# Patient Record
Sex: Male | Born: 1985 | Race: Black or African American | Hispanic: No | Marital: Single | Smoking: Current every day smoker
Health system: Southern US, Community
[De-identification: ages and names within clinical notes are randomized; demographics above are authoritative.]

---

## 2006-06-24 ENCOUNTER — Emergency Department (HOSPITAL_COMMUNITY): Admission: EM | Admit: 2006-06-24 | Discharge: 2006-06-24 | Payer: Self-pay | Admitting: Emergency Medicine

## 2006-07-02 ENCOUNTER — Emergency Department (HOSPITAL_COMMUNITY): Admission: EM | Admit: 2006-07-02 | Discharge: 2006-07-02 | Payer: Self-pay | Admitting: Emergency Medicine

## 2013-11-01 ENCOUNTER — Encounter (HOSPITAL_COMMUNITY): Payer: Self-pay | Admitting: Emergency Medicine

## 2013-11-01 ENCOUNTER — Emergency Department (HOSPITAL_COMMUNITY)
Admission: EM | Admit: 2013-11-01 | Discharge: 2013-11-01 | Disposition: A | Discharge status: Home / Self Care | Payer: Self-pay | Attending: Emergency Medicine | Admitting: Emergency Medicine

## 2013-11-01 DIAGNOSIS — R51 Headache: Secondary | ICD-10-CM | POA: Insufficient documentation

## 2013-11-01 DIAGNOSIS — F17200 Nicotine dependence, unspecified, uncomplicated: Secondary | ICD-10-CM | POA: Insufficient documentation

## 2013-11-01 DIAGNOSIS — R519 Headache, unspecified: Secondary | ICD-10-CM

## 2013-11-01 DIAGNOSIS — H53149 Visual discomfort, unspecified: Secondary | ICD-10-CM | POA: Insufficient documentation

## 2013-11-01 NOTE — ED Notes (Signed)
Pt to department c/o of headache. Reports it has been present for a couple of weeks. Reports light sensitivity, blurry vision. Reports more pain on the left side.

## 2013-11-01 NOTE — ED Notes (Signed)
Pt states hes been having in and out headaches for the last couple years. Pt denies taking anything for it. Seasonal headaches. Comes and goes same time every year. Pt thinks he has cluster headaches. Pt currently denies any pain at this time. Pt states he last had a headache at 4pm, its gone now.

## 2013-11-01 NOTE — ED Provider Notes (Signed)
CSN: [id number]     Arrival date & time 11/01/13  1758 History   First MD Initiated Contact with Patient 11/01/13 1843     Chief Complaint  Patient presents with  . Headache   (Consider location/radiation/quality/duration/timing/severity/associated sxs/prior Treatment) HPI Comments: Patient presents to the emergency department with chief complaint of headaches. He states that he's been having intermittent headaches for the past 4-5 years. He states that the headaches are worsened in the winter. He states that he has never been worked up for headaches before. He states that they come and go, and that he is concerned about cluster headaches. He denies headache at this time, but wishes to be evaluated. He denies any fevers or chills. States the leg does have headaches he has photophobia and phonophobia, and states the headaches are more located on the left side. Denies any neurologic deficits. Denies any ataxia. He states that he normally treats the headache with BC powder, which helps significantly.  The history is provided by the patient. No language interpreter was used.    History reviewed. No pertinent past medical history. History reviewed. No pertinent past surgical history. History reviewed. No pertinent family history. History  Substance Use Topics  . Smoking status: Current Every Day Smoker    Types: Cigarettes  . Smokeless tobacco: Not on file  . Alcohol Use: Yes    Review of Systems  All other systems reviewed and are negative.    Allergies  Review of patient's allergies indicates no known allergies.  Home Medications   Current Outpatient Rx  Name  Route  Sig  Dispense  Refill  . Aspirin-Salicylamide-Caffeine (BC HEADACHE POWDER PO)   Oral   Take 1 packet by mouth 2 (two) times daily as needed (pain).         Marland Kitchen diphenhydramine-acetaminophen (TYLENOL PM) 25-500 MG TABS   Oral   Take 1 tablet by mouth at bedtime as needed.          BP 140/76  Pulse 73   Temp(Src) 98.2 F (36.8 C) (Oral)  Resp 18  Ht 5\' 10"  (1.778 m)  Wt 190 lb 2 oz (86.24 kg)  BMI 27.28 kg/m2  SpO2 97% Physical Exam  Nursing note and vitals reviewed. Constitutional: He is oriented to person, place, and time. He appears well-developed and well-nourished.  HENT:  Head: Normocephalic and atraumatic.  Right Ear: External ear normal.  Left Ear: External ear normal.  Eyes: Conjunctivae and EOM are normal. Pupils are equal, round, and reactive to light.  Neck: Normal range of motion. Neck supple.  No pain with neck flexion, no meningismus  Cardiovascular: Normal rate, regular rhythm and normal heart sounds.  Exam reveals no gallop and no friction rub.   No murmur heard. Pulmonary/Chest: Effort normal and breath sounds normal. No respiratory distress. He has no wheezes. He has no rales. He exhibits no tenderness.  Abdominal: Soft. He exhibits no distension. There is no tenderness.  Musculoskeletal: Normal range of motion. He exhibits no edema and no tenderness.  Normal gait.  Neurological: He is alert and oriented to person, place, and time.  CN 3-12 intact, no pronator drift, normal shin to heel, normal RAM, sensation and strength intact bilaterally.  Skin: Skin is warm and dry.  Psychiatric: He has a normal mood and affect. His behavior is normal. Judgment and thought content normal.    ED Course  Procedures (including critical care time) Labs Review Labs Reviewed - No data to display Imaging Review No results  found.  EKG Interpretation   None       MDM   1. Headache    No headache in the ED.  Presentation is concerning for cluster HA and non concerning for [facility], ICH, Meningitis, or temporal arteritis. Pt is afebrile with no focal neuro deficits, nuchal rigidity, or change in vision. Pt is to follow up with neurology to discuss prophylactic medication. Pt verbalizes understanding and is agreeable with plan to dc.        Roxy Horseman, PA-C 11/01/13  1900

## 2013-11-02 NOTE — ED Provider Notes (Signed)
  Medical screening examination/treatment/procedure(s) were performed by non-physician practitioner and as supervising physician I was immediately available for consultation/collaboration.  EKG Interpretation   None          Eriyana Sweeten, MD 11/02/13 0036 

## 2016-03-30 ENCOUNTER — Emergency Department (HOSPITAL_COMMUNITY): Payer: 59

## 2016-03-30 ENCOUNTER — Encounter (HOSPITAL_COMMUNITY): Payer: Self-pay | Admitting: Emergency Medicine

## 2016-03-30 ENCOUNTER — Emergency Department (HOSPITAL_COMMUNITY)
Admission: EM | Admit: 2016-03-30 | Discharge: 2016-03-30 | Disposition: A | Discharge status: Home / Self Care | Payer: 59 | Attending: Emergency Medicine | Admitting: Emergency Medicine

## 2016-03-30 DIAGNOSIS — M26602 Left temporomandibular joint disorder, unspecified: Secondary | ICD-10-CM | POA: Insufficient documentation

## 2016-03-30 DIAGNOSIS — F17210 Nicotine dependence, cigarettes, uncomplicated: Secondary | ICD-10-CM | POA: Diagnosis not present

## 2016-03-30 DIAGNOSIS — M26609 Unspecified temporomandibular joint disorder, unspecified side: Secondary | ICD-10-CM

## 2016-03-30 DIAGNOSIS — R6884 Jaw pain: Secondary | ICD-10-CM | POA: Insufficient documentation

## 2016-03-30 MED ORDER — CYCLOBENZAPRINE HCL 10 MG PO TABS: 10.0000 mg | ORAL_TABLET | Freq: Every day | ORAL | Status: DC

## 2016-03-30 MED ORDER — NAPROXEN 500 MG PO TABS
500.0000 mg | ORAL_TABLET | Freq: Two times a day (BID) | ORAL | Status: DC
Start: 2016-03-30 — End: 2021-03-22

## 2016-03-30 MED ORDER — HYDROCODONE-ACETAMINOPHEN 5-325 MG PO TABS
1.0000 | ORAL_TABLET | Freq: Once | ORAL | Status: AC
  Administered 2016-03-30: 1 via ORAL
  Filled 2016-03-30: qty 1

## 2016-03-30 NOTE — ED Notes (Signed)
Started yesterday with left jaw pain. No known injury. States is difficult to open his mouth.

## 2016-03-30 NOTE — Discharge Instructions (Signed)
Please read and follow all provided instructions.  Your child's diagnoses today include:  1. Temporomandibular disorder   2. Jaw pain    Tests performed today include:  Vital signs. See below for results today.   Medications prescribed:   Naproxen - anti-inflammatory pain medication  Do not exceed 500mg  naproxen every 12 hours, take with food  You have been prescribed an anti-inflammatory medication or NSAID. Take with food. Take smallest effective dose for the shortest duration needed for your pain. Stop taking if you experience stomach pain or vomiting.    Flexeril (cyclobenzaprine) - muscle relaxer medication  DO NOT drive or perform any activities that require you to be awake and alert because this medicine can make you drowsy.   Take any prescribed medications only as directed.  Home care instructions:  Follow any educational materials contained in this packet.  Eat soft foods for the next 48-72 hours to let your jaw rest.   Follow-up instructions: Please follow-up with the dentist referral if you are not better in 72 hours.    Return instructions:   Please return to the Emergency Department if your child experiences worsening symptoms.   Please return if you have any other emergent concerns.  Additional Information:  Your child's vital signs today were: BP 126/57 mmHg   Pulse 76   Temp(Src) 98.5 F (36.9 C)   Resp 18   Ht 5\' 10"  (1.778 m)   Wt 95.255 kg   BMI 30.13 kg/m2   SpO2 98% If blood pressure (BP) was elevated above 135/85 this visit, please have this repeated by your pediatrician within one month. --------------

## 2016-03-30 NOTE — ED Provider Notes (Signed)
CSN: [id number]     Arrival date & time 03/30/16  4098 History  By signing my name below, I, Austin Rivers, attest that this documentation has been prepared under the direction and in the presence of Austin Crigler, PA-C. Electronically Signed: Evon Rivers, ED Scribe. 03/30/2016. 9:35 AM.  Chief Complaint  Patient presents with  . Jaw Pain    The history is provided by the patient. No language interpreter was used.   HPI Comments: Austin Rivers is a 30 y.o. male who presents to the Emergency Department complaining of gradually worsening left sided jaw/TMJ pain onset 1 day prior. Pt doesn't report any associated symptoms. Pt states that the pain is worse when he tried to open his mouth. Pt denies injury or trauma to the jaw. Denies dental pain, neck pain or trouble swallowing. No fever, neck swelling or trouble breathing.    History reviewed. No pertinent past medical history. History reviewed. No pertinent past surgical history. No family history on file. Social History  Substance Use Topics  . Smoking status: Current Every Day Smoker    Types: Cigarettes  . Smokeless tobacco: None  . Alcohol Use: No    Review of Systems  Constitutional: Negative for fever.  HENT: Negative for dental problem, ear pain, facial swelling, sore throat and trouble swallowing.   Respiratory: Negative for shortness of breath and stridor.   Musculoskeletal: Positive for arthralgias (left sided jaw pain). Negative for neck pain.  Skin: Negative for color change.  Neurological: Negative for headaches.     Allergies  Review of patient's allergies indicates no known allergies.  Home Medications   Prior to Admission medications   Medication Sig Start Date End Date Taking? Authorizing Provider  Aspirin-Salicylamide-Caffeine (BC HEADACHE POWDER PO) Take 1 packet by mouth 2 (two) times daily as needed (pain).    Historical Provider, MD  diphenhydramine-acetaminophen (TYLENOL PM) 25-500 MG TABS Take 1  tablet by mouth at bedtime as needed.    Historical Provider, MD   BP 126/57 mmHg  Pulse 76  Temp(Src) 98.5 F (36.9 C)  Resp 18  Ht  (1.778 m)  Wt 210 lb (95.255 kg)  BMI 30.13 kg/m2  SpO2 98%   Physical Exam  Constitutional: He appears well-developed and well-nourished. No distress.  HENT:  Head: Normocephalic and atraumatic.  Right Ear: Tympanic membrane, external ear and ear canal normal.  Left Ear: Tympanic membrane, external ear and ear canal normal.  Nose: Nose normal.  Mouth/Throat: Uvula is midline, oropharynx is clear and moist and mucous membranes are normal. No trismus in the jaw. Normal dentition. No dental abscesses or uvula swelling. No tonsillar abscesses.  Tenderness to palpation over the left TMJ. No swelling. Intraoral evaluation appears normal without gross or palpable abscess. Patient has some difficulty opening his mouth but this seems to be due more to pain than a dislocation.   Eyes: Conjunctivae and EOM are normal. Pupils are equal, round, and reactive to light.  Neck: Normal range of motion. Neck supple. No tracheal deviation present.  No neck swelling or Lugwig's angina  Cardiovascular: Normal rate.   Pulmonary/Chest: Effort normal. No respiratory distress.  Musculoskeletal: Normal range of motion.  Neurological: He is alert.  Skin: Skin is warm and dry.  Psychiatric: He has a normal mood and affect. His behavior is normal.  Nursing note and vitals reviewed.   ED Course  Procedures (including critical care time) DIAGNOSTIC STUDIES: Oxygen Saturation is 99% on RA, normal by my interpretation.  COORDINATION OF CARE: 9:33 AM-Will order panorex. Will give Vicodin for pain. Discussed treatment plan with pt at bedside and pt agreed to plan.   10:35 AM- Informed of negative x-Austin Rivers results. Joint appears located. Discussed with plan for dental follow up in 2-3 days if not improved. Encouraged soft foods for next 48-72 hrs. Will discharge with NSAIDs  and muscle relaxer's.  Patient counseled on proper use of muscle relaxant medication.  They were told not to drink alcohol, drive any vehicle, or do any dangerous activities while taking this medication.  Patient verbalized understanding.   Imaging Review Dg Orthopantogram  03/30/2016  CLINICAL DATA:  Left-sided jaw pain no known injury, initial encounter EXAM: ORTHOPANTOGRAM/PANORAMIC COMPARISON:  None. FINDINGS: The mandible is intact. No acute bony abnormality is noted. No focal dental abnormality is seen. IMPRESSION: No acute abnormality noted. Electronically Signed   By: Alcide CleverMark  Rivers M.D.   On: 03/30/2016 10:10     Vital signs reviewed and are as follows: Filed Vitals:   03/30/16 0905  BP: 126/57  Pulse: 76  Temp: 98.5 F (36.9 C)  Resp: 18     MDM   Final diagnoses:  Jaw pain  Temporomandibular disorder   Patient with discreet L TMJ pain. No neck swelling To suggest deep space infection and trismus. No dental infections. Unclear etiology of the patient's pain. Jaw appears located. He can open jaw somewhat but limited due to pain. No systemic symptoms of illness suggestive of joint infection. Dental follow-up given as there is no oral surgery referral on today.  I personally performed the services described in this documentation, which was scribed in my presence. The recorded information has been reviewed and is accurate.       Austin CriglerJoshua Pricella Gaugh, PA-C 03/30/16 1056  Linwood DibblesJon Knapp, MD 03/31/16 1010

## 2020-01-14 ENCOUNTER — Other Ambulatory Visit: Payer: Self-pay

## 2020-01-14 ENCOUNTER — Encounter (HOSPITAL_COMMUNITY): Payer: Self-pay | Admitting: Emergency Medicine

## 2020-01-14 ENCOUNTER — Ambulatory Visit (HOSPITAL_COMMUNITY)
Admission: EM | Admit: 2020-01-14 | Discharge: 2020-01-14 | Disposition: A | Discharge status: Home / Self Care | Payer: Self-pay | Attending: Urgent Care | Admitting: Urgent Care

## 2020-01-14 DIAGNOSIS — F17200 Nicotine dependence, unspecified, uncomplicated: Secondary | ICD-10-CM

## 2020-01-14 DIAGNOSIS — R0982 Postnasal drip: Secondary | ICD-10-CM

## 2020-01-14 DIAGNOSIS — R519 Headache, unspecified: Secondary | ICD-10-CM

## 2020-01-14 DIAGNOSIS — G43809 Other migraine, not intractable, without status migrainosus: Secondary | ICD-10-CM

## 2020-01-14 MED ORDER — SUMATRIPTAN SUCCINATE 6 MG/0.5ML ~~LOC~~ SOLN
SUBCUTANEOUS | Status: AC
  Filled 2020-01-14: qty 0.5

## 2020-01-14 MED ORDER — SUMATRIPTAN SUCCINATE 50 MG PO TABS: ORAL_TABLET | ORAL | 0 refills | Status: DC

## 2020-01-14 MED ORDER — SUMATRIPTAN SUCCINATE 6 MG/0.5ML ~~LOC~~ SOLN
6.0000 mg | Freq: Once | SUBCUTANEOUS | Status: AC
  Administered 2020-01-14: 10:00:00 6 mg via SUBCUTANEOUS

## 2020-01-14 NOTE — ED Provider Notes (Signed)
[name]   MRN: [id number] DOB: 12/02/1985  Subjective:   Austin Rivers is a 34 y.o. male presenting for 12-day history of persistent intermittent left-sided temporal headaches worse at night.  States that he also has some sinus pressure on that side.  Gets blurred vision with his headaches but not loss of vision.  Has a history of migraines.  Patient is also an active smoker.  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  No current facility-administered medications for this encounter.  Current Outpatient Medications:  .  Aspirin-Salicylamide-Caffeine (BC HEADACHE POWDER PO), Take 1 packet by mouth 2 (two) times daily as needed (pain)., Disp: , Rfl:  .  cyclobenzaprine (FLEXERIL) 10 MG tablet, Take 1 tablet (10 mg total) by mouth at bedtime. (Patient not taking: Reported on 01/14/2020), Disp: 10 tablet, Rfl: 0 .  diphenhydramine-acetaminophen (TYLENOL PM) 25-500 MG TABS, Take 1 tablet by mouth at bedtime as needed., Disp: , Rfl:  .  naproxen (NAPROSYN) 500 MG tablet, Take 1 tablet (500 mg total) by mouth 2 (two) times daily., Disp: 20 tablet, Rfl: 0   No Known Allergies  History reviewed. No pertinent past medical history.   History reviewed. No pertinent surgical history.  History reviewed. No pertinent family history.  Social History   Tobacco Use  . Smoking status: Current Every Day Smoker    Types: Cigarettes  Substance Use Topics  . Alcohol use: No  . Drug use: No    Review of Systems  Constitutional: Negative for fever and malaise/fatigue.  HENT: Negative for congestion, ear pain, sinus pain and sore throat.   Eyes: Positive for blurred vision and photophobia. Negative for double vision, pain, discharge and redness.  Respiratory: Negative for cough, hemoptysis, shortness of breath and wheezing.   Cardiovascular: Negative for chest pain.  Gastrointestinal: Negative for abdominal pain, diarrhea, nausea and vomiting.  Genitourinary:  Negative for dysuria, flank pain and hematuria.  Musculoskeletal: Negative for myalgias.  Skin: Negative for rash.  Neurological: Positive for headaches. Negative for dizziness, tingling, tremors, focal weakness and weakness.  Psychiatric/Behavioral: Negative for depression and substance abuse.     Objective:   Vitals: BP 129/84 (BP Location: Right Arm)   Pulse 85   Temp 97.9 F (36.6 C) (Oral)   Resp 18   SpO2 98%   Physical Exam Constitutional:      General: He is not in acute distress.    Appearance: Normal appearance. He is well-developed and normal weight. He is not ill-appearing, toxic-appearing or diaphoretic.  HENT:     Head: Normocephalic and atraumatic.     Right Ear: Tympanic membrane, ear canal and external ear normal. There is no impacted cerumen.     Left Ear: Tympanic membrane, ear canal and external ear normal. There is no impacted cerumen.     Nose: Congestion present. No rhinorrhea.     Mouth/Throat:     Mouth: Mucous membranes are moist.     Pharynx: No oropharyngeal exudate or posterior oropharyngeal erythema.     Comments: Significant postnasal drainage overlying pharynx. Eyes:     General: No scleral icterus.       Right eye: No discharge.        Left eye: No discharge.     Extraocular Movements: Extraocular movements intact.     Conjunctiva/sclera: Conjunctivae normal.     Pupils: Pupils are equal, round, and reactive to light.  Cardiovascular:     Rate and Rhythm: Normal rate.  Pulmonary:     Effort: Pulmonary effort is normal.  Musculoskeletal:     Cervical back: Normal range of motion and neck supple. No rigidity. No muscular tenderness.  Neurological:     General: No focal deficit present.     Mental Status: He is alert and oriented to person, place, and time.     Cranial Nerves: No cranial nerve deficit or facial asymmetry.     Motor: No weakness.     Coordination: Romberg sign negative. Coordination normal.     Gait: Gait normal.    Psychiatric:        Mood and Affect: Mood normal.        Behavior: Behavior normal.        Thought Content: Thought content normal.        Judgment: Judgment normal.     Assessment and Plan :   1. Other migraine without status migrainosus, not intractable   2. Temporal headache   3. Post-nasal drainage   4. Smoker    Patient given subcutaneous sumatriptan to address his concerns for cluster headache.  Counseled patient that I suspect he is having migraines triggered by sinusitis given his physical exam findings.  Patient is neurologically intact, counseled against imaging or ER visit at this time.  He denied concerns for sinusitis and prefers management with sumatriptan.  Provided him with information for [facility] internal medicine to establish with a new PCP.  Encourage patient to consider further work-up and outpatient imaging if he continues to have symptoms. Counseled patient on potential for adverse effects with medications prescribed/recommended today, ER and return-to-clinic precautions discussed, patient verbalized understanding.    Wallis Bamberg, PA-C 01/14/20 1046

## 2020-01-14 NOTE — ED Triage Notes (Signed)
Pt sts HA to left side of head x 2 weeks

## 2021-03-21 ENCOUNTER — Other Ambulatory Visit: Payer: Self-pay

## 2021-03-21 ENCOUNTER — Ambulatory Visit (INDEPENDENT_AMBULATORY_CARE_PROVIDER_SITE_OTHER): Payer: 59 | Admitting: Family Medicine

## 2021-03-21 VITALS — BP 126/70 | HR 67 | Ht 70.0 in | Wt 175.6 lb

## 2021-03-21 DIAGNOSIS — F17200 Nicotine dependence, unspecified, uncomplicated: Secondary | ICD-10-CM | POA: Diagnosis not present

## 2021-03-21 DIAGNOSIS — R42 Dizziness and giddiness: Secondary | ICD-10-CM

## 2021-03-21 MED ORDER — NICOTINE 14 MG/24HR TD PT24: 14.0000 mg | MEDICATED_PATCH | Freq: Every day | TRANSDERMAL | 0 refills | Status: AC

## 2021-03-21 NOTE — Patient Instructions (Addendum)
It was great seeing you today! Today you came to establish care. We addressed your dizzy spells which I believe are due to what we talked about called orthostatic hypotension (more information below). Please increase your water intake, aiming to drink half your body weight in ounces.   I also prescribed a nicotine patch to help quit smoking.    For your allergies I recommend getting a nasal glucocorticoid spray which you can get over the counter.   Please check-out at the front desk before leaving the clinic. I'd like to see you back in 4-6 weeks to check on how the nicotine patch has been working, as well as follow up on your dizzy spells, but if you need to be seen earlier than that for any new issues we're happy to fit you in, just give [place] a call!  Visit Reminders: - Stop by the pharmacy to pick up your prescriptions - Continue to work on your healthy eating habits, drinking more water, and incorporating exercise into your daily life.   If you haven't already, sign up for My Chart to have easy access to your labs results, and communication with your primary care physician.  Feel free to call with any questions or concerns at any time, at [phone].   Take care,  Dr. Cora Collum [facility]   Nicotine skin patches What is this medicine? NICOTINE (NIK [place] teen) helps people stop smoking. The patches replace the nicotine found in cigarettes and help to decrease withdrawal effects. They are most effective when used in combination with a stop-smoking program. This medicine may be used for other purposes; ask your health care provider or pharmacist if you have questions. COMMON BRAND NAME(S): Habitrol, Nicoderm CQ, Nicotrol What should I tell my health care provider before I take this medicine? They need to know if you have any of these conditions:  diabetes  heart disease, angina, irregular heartbeat or previous heart attack  high blood pressure  lung  disease, including asthma  overactive thyroid  pheochromocytoma  seizures or a history of seizures  skin problems, like eczema  stomach problems or ulcers  an unusual or allergic reaction to nicotine, adhesives, other medicines, foods, dyes, or preservatives  pregnant or trying to get pregnant  breast-feeding How should I use this medicine? This medicine is for external use only. Follow the directions on the label. Do not cut or trim the patch. After applying the patch, wash your hands. Change the patch every day in the morning, keeping to a regular schedule. When you apply a new patch, use a new area of skin. Wait at least 1 week before using the same area again. This drug comes with INSTRUCTIONS FOR USE. Ask your pharmacist for directions on how to use this drug. Read the information carefully. Talk to your pharmacist or health care provider if you have questions. Talk to your pediatrician regarding the use of this medicine in children. Special care may be needed. Overdosage: If you think you have taken too much of this medicine contact a poison control center or emergency room at once. NOTE: This medicine is only for you. Do not share this medicine with others. What if I miss a dose? If you forget to replace a patch, use it as soon as you can. Only use one patch at a time and do not leave on the skin for longer than directed. If a patch falls off, you can replace it, but keep to your schedule  and remove the patch at the right time. What may interact with this medicine?  certain medicines for lung or breathing disease, like asthma  medicines for blood pressure  medicines for depression This list may not describe all possible interactions. Give your health care provider a list of all the medicines, herbs, non-prescription drugs, or dietary supplements you use. Also tell them if you smoke, drink alcohol, or use illegal drugs. Some items may interact with your medicine. What should I  watch for while using this medicine? Visit your health care provider for regular checks on your progress. Talk to your health care provider about what you can do to improve your chances of quitting. If you are going to need surgery, an MRI, CT scan, or other procedure, tell your health care provider that you are using this drug. You may need to remove the patch before the procedure. What side effects may I notice from receiving this medicine? Side effects that you should report to your doctor or health care professional as soon as possible:  allergic reactions like skin rash, itching or hives, swelling of the face, lips, or tongue  breathing problems  changes in hearing  changes in vision  chest pain  cold sweats  confusion  fast, irregular heartbeat  feeling faint or lightheaded, falls  headache  increased saliva  skin redness that lasts more than 4 days  stomach pain  signs and symptoms of nicotine overdose like nausea; vomiting; dizziness; weakness; and rapid heartbeat Side effects that usually do not require medical attention (report to your doctor or health care professional if they continue or are bothersome):  diarrhea  dry mouth  hiccups  irritability  nervousness or restlessness  trouble sleeping or vivid dreams This list may not describe all possible side effects. Call your doctor for medical advice about side effects. You may report side effects to FDA at 1-800-FDA-1088. Where should I keep my medicine? This product may have enough nicotine in it to make children and pets sick. Keep it away from children and pets. After using, throw away as directed on the package. Store at room temperature between 20 and 25 degrees C (68 and 77 degrees F). Protect from heat and light. Keep this medicine in the original container until ready to use. Throw away unused medicine after the expiration date. NOTE: This sheet is a summary. It may not cover all possible  information. If you have questions about this medicine, talk to your doctor, pharmacist, or health care provider.  2021 Elsevier/Gold Standard (2019-10-30 16:54:39)   Orthostatic Hypotension Blood pressure is a measurement of how strongly, or weakly, your blood is pressing against the walls of your arteries. Orthostatic hypotension is a sudden drop in blood pressure that happens when you quickly change positions, such as when you get up from sitting or lying down. Arteries are blood vessels that carry blood from your heart throughout your body. When blood pressure is too low, you may not get enough blood to your brain or to the rest of your organs. This can cause weakness, light-headedness, rapid heartbeat, and fainting. This can last for just a few seconds or for up to a few minutes. Orthostatic hypotension is usually not a serious problem. However, if it happens frequently or gets worse, it may be a sign of something more serious. What are the causes? This condition may be caused by:  Sudden changes in posture, such as standing up quickly after you have been sitting or lying down.  Blood loss.  Loss of body fluids (dehydration).  Heart problems.  Hormone (endocrine) problems.  Pregnancy.  Severe infection.  Lack of certain nutrients.  Severe allergic reactions (anaphylaxis).  Certain medicines, such as blood pressure medicine or medicines that make the body lose excess fluids (diuretics). Sometimes, this condition can be caused by not taking medicine as directed, such as taking too much of a certain medicine. What increases the risk? The following factors may make you more likely to develop this condition:  Age. Risk increases as you get older.  Conditions that affect the heart or the central nervous system.  Taking certain medicines, such as blood pressure medicine or diuretics.  Being pregnant. What are the signs or symptoms? Symptoms of this condition may  include:  Weakness.  Light-headedness.  Dizziness.  Blurred vision.  Fatigue.  Rapid heartbeat.  Fainting, in severe cases. How is this diagnosed? This condition is diagnosed based on:  Your medical history.  Your symptoms.  Your blood pressure measurement. Your health care provider will check your blood pressure when you are: ? Lying down. ? Sitting. ? Standing. A blood pressure reading is recorded as two numbers, such as "120 over 80" (or 120/80). The first ("top") number is called the systolic pressure. It is a measure of the pressure in your arteries as your heart beats. The second ("bottom") number is called the diastolic pressure. It is a measure of the pressure in your arteries when your heart relaxes between beats. Blood pressure is measured in a unit called mm Hg. Healthy blood pressure for most adults is 120/80. If your blood pressure is below 90/60, you may be diagnosed with hypotension. Other information or tests that may be used to diagnose orthostatic hypotension include:  Your other vital signs, such as your heart rate and temperature.  Blood tests.  Tilt table test. For this test, you will be safely secured to a table that moves you from a lying position to an upright position. Your heart rhythm and blood pressure will be monitored during the test. How is this treated? This condition may be treated by:  Changing your diet. This may involve eating more salt (sodium) or drinking more water.  Taking medicines to raise your blood pressure.  Changing the dosage of certain medicines you are taking that might be lowering your blood pressure.  Wearing compression stockings. These stockings help to prevent blood clots and reduce swelling in your legs. In some cases, you may need to go to the hospital for:  Fluid replacement. This means you will receive fluids through an IV.  Blood replacement. This means you will receive donated blood through an IV  (transfusion).  Treating an infection or heart problems, if this applies.  Monitoring. You may need to be monitored while medicines that you are taking wear off. Follow these instructions at home: Eating and drinking  Drink enough fluid to keep your urine pale yellow.  Eat a healthy diet, and follow instructions from your health care provider about eating or drinking restrictions. A healthy diet includes: ? Fresh fruits and vegetables. ? Whole grains. ? Lean meats. ? Low-fat dairy products.  Eat extra salt only as directed. Do not add extra salt to your diet unless your health care provider told you to do that.  Eat frequent, small meals.  Avoid standing up suddenly after eating.   Medicines  Take over-the-counter and prescription medicines only as told by your health care provider. ? Follow instructions from your health care  provider about changing the dosage of your current medicines, if this applies. ? Do not stop or adjust any of your medicines on your own. General instructions  Wear compression stockings as told by your health care provider.  Get up slowly from lying down or sitting positions. This gives your blood pressure a chance to adjust.  Avoid hot showers and excessive heat as directed by your health care provider.  Return to your normal activities as told by your health care provider. Ask your health care provider what activities are safe for you.  Do not use any products that contain nicotine or tobacco, such as cigarettes, e-cigarettes, and chewing tobacco. If you need help quitting, ask your health care provider.  Keep all follow-up visits as told by your health care provider. This is important.   Contact a health care provider if you:  Vomit.  Have diarrhea.  Have a fever for more than 2-3 days.  Feel more thirsty than usual.  Feel weak and tired. Get help right away if you:  Have chest pain.  Have a fast or irregular heartbeat.  Develop  numbness in any part of your body.  Cannot move your arms or your legs.  Have trouble speaking.  Become sweaty or feel light-headed.  Faint.  Feel short of breath.  Have trouble staying awake.  Feel confused. Summary  Orthostatic hypotension is a sudden drop in blood pressure that happens when you quickly change positions.  Orthostatic hypotension is usually not a serious problem.  It is diagnosed by having your blood pressure taken lying down, sitting, and then standing.  It may be treated by changing your diet or adjusting your medicines. This information is not intended to replace advice given to you by your health care provider. Make sure you discuss any questions you have with your health care provider. Document Revised: 05/09/2018 Document Reviewed: 05/09/2018 Elsevier Patient Education  2021 [employer]

## 2021-03-21 NOTE — Progress Notes (Signed)
    SUBJECTIVE:   CHIEF COMPLAINT / HPI:   Mr. Thackston is a 35 yo who presents to establish care.  He reports not seeing a doctor for many years. States his wife and daughter are both patients here.  He reports dizzy spells within the past year. Mainly when changing positions from seated to standing after doing strenuous activity. Typically happens in first hour of work. Not every day. Denies feeling like the room is spinning. Denies any syncopal episodes. Denies drinking much water. Denies CP, SOB, does endorse occasional palpitations, denies headaches, vision changes.   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 No allergies to medication      Immunizations:  Declines COVID vaccine   PSH: None   Medication: Occasional Diphenhydramine for allergies     Fam Hx:   Dad - MI 2 in the past year with stent placement  Mom- possible diabetes    Social:  Works at [employer] and is active   Current cigarette smoker- 4 or 5 a day since 61 . Down from a pack a day Recreational drug use: None  Alcohol use: bear and wine, 1 glass per week  Sexually active with 1 partner (wife). No concern for STI   No concern for depression or anxiety  OBJECTIVE:   There were no vitals taken for this visit.   Physical exam  General: well appearing, NAD HEENT: PERRLA. Normal conjunctiva. MMM. No oral lesions  Cardiovascular: RRR, no murmurs Lungs: CTAB. Normal WOB Abdomen: soft, non-distended, non-tender Skin: warm, dry. No edema  ASSESSMENT/PLAN:   No problem-specific Assessment & Plan notes found for this encounter.   Dizziness Likely due to orthostatic hypotension in the setting of dehydration as episodes occur when patient goes from seated to standing positions when at work doing strenuous activity and without much water intake throughout the day. Not medication related as patient denies taking anything. Unlikely to be vertigo as he denies the sensation of  the room spinning, or dizziness with changes in head direction.  - advised to increase water intake with the goal of half his body weight in ounces  Tobacco Use  Current cigarette smoker. Approx 11 pack year history. Interested in quitting -  Nicotine patch 14mg   Health Maintenance Declined COVID and Tdap vaccines. Due for Hep C screen  - F/u on vaccines and Hep C at next visit  F/u in 4-6 weeks to check on smoking cessation   , DO [facility]

## 2021-03-22 ENCOUNTER — Encounter: Payer: Self-pay | Admitting: Family Medicine

## 2021-04-29 ENCOUNTER — Ambulatory Visit: Payer: 59 | Admitting: Family Medicine

## 2021-05-12 NOTE — Progress Notes (Signed)
    SUBJECTIVE:   CHIEF COMPLAINT / HPI:   Austin Rivers is a 35 yo who presents for a follow up from his new patient visit on 4/25. At last visit he endorsed smoking a half a pack of cigarettes a day and I prescribed a nicotine patch at that time which he reports now smoking about 2 cigarettes a day. States the nicotine patch has been helping reduce cravings. Feels confident he can quit completely and doesn't feel he would need another Rx   Additionally we discussed his orthostatic hypotension and he endorses improvement and not feeling dizzy at work since increasing his water intake. He does endorse getting cramps in his upper legs while working which last for for about an hour once a week despite increasing his water intake.   Health maintenance: Believes he got the Tdap in 2018. Declines COVID vaccine.  For diet eats a variety of foods including vegetables.  Endorses physical activity with his work.   PERTINENT  PMH / PSH:  Tobacco Use   OBJECTIVE:   BP 113/68   Pulse 76   Wt 170 lb 6.4 oz (77.3 kg)   SpO2 97%   BMI 24.45 kg/m    General: alert, pleasant, NAD CV: RRR no murmurs Resp: CTAB normal WOB GI: soft, non distended   ASSESSMENT/PLAN:   No problem-specific Assessment & Plan notes found for this encounter.   Tobacco use  Has cut down to 2 cigarettes a day from 10 in April  - continue Nicotine patch 14mg    Leg cramps Endorses bilateral upper leg cramping occasionally while doing strenuous work. Likely due to dehydration or muscle strain vs vitamin deficiency. Low concern for vascular issues as he has no hx of HTN or other medical conditions but does have risk factor of tobacco use. Encouraged tobacco cessation (see above)  - recommended once daily MVI  - continue staying well hydrated during activity  - recommended stretching   Health maintenance  Declines COVID vaccine. Believes he received Tdap in 2018. Discussed healthy eating and physical activity - Hep C, HIV,  RPR   Return in 1 year for next annual physical   2019, DO [facility]

## 2021-05-13 ENCOUNTER — Ambulatory Visit (INDEPENDENT_AMBULATORY_CARE_PROVIDER_SITE_OTHER): Payer: 59 | Admitting: Family Medicine

## 2021-05-13 ENCOUNTER — Encounter: Payer: Self-pay | Admitting: Family Medicine

## 2021-05-13 ENCOUNTER — Other Ambulatory Visit: Payer: Self-pay

## 2021-05-13 VITALS — BP 113/68 | HR 76 | Wt 170.4 lb

## 2021-05-13 DIAGNOSIS — Z0000 Encounter for general adult medical examination without abnormal findings: Secondary | ICD-10-CM | POA: Diagnosis not present

## 2021-05-13 DIAGNOSIS — Z720 Tobacco use: Secondary | ICD-10-CM

## 2021-05-13 NOTE — Patient Instructions (Addendum)
It was great seeing you today! Today we followed up on your tobacco use, and I am glad to hear you have cut down and are well on your way to quitting completely!  Today we also took care of your health screening items- HIV, Hepatitis C, and Syphilis. I will call you if any results are abnormal.   We also discussed your cramping and in additional to staying well hydrated I ecommend trying a daily multivitamin which you can get over the counter.   Please check-out at the front desk before leaving the clinic. I'd like to see you back in 1 year for your annual phsycial, but if you need to be seen earlier than that for any new issues we're happy to fit you in, just give [place] a call!  Visit Reminders: - Stop by the store to get your vitamin  - Continue to work on your healthy eating habits and incorporating exercise into your daily life.  If you haven't already, sign up for My Chart to have easy access to your labs results, and communication with your primary care physician.  Feel free to call with any questions or concerns at any time, at [phone].   Take care,  Dr. Cora Collum [facility]

## 2021-05-14 LAB — HCV INTERPRETATION

## 2021-05-14 LAB — HCV AB W REFLEX TO QUANT PCR: HCV Ab: 0.1 s/co ratio (ref 0.0–0.9)

## 2021-05-14 LAB — RPR: RPR Ser Ql: NONREACTIVE

## 2021-05-14 LAB — HIV ANTIBODY (ROUTINE TESTING W REFLEX): HIV Screen 4th Generation wRfx: NONREACTIVE

## 2021-07-29 ENCOUNTER — Telehealth: Payer: Self-pay

## 2021-07-29 NOTE — Telephone Encounter (Signed)
Yes. Please re order the medication. Thank you!  Veronda Prude, RN

## 2021-07-29 NOTE — Telephone Encounter (Signed)
Patient calls nurse line requesting refill on Sumatriptan 50 mg tablets. This is not on current medication list and appears to have been discontinued on 4/26.  Please advise if patient can receive refill on medication.   Veronda Prude, RN

## 2021-08-02 ENCOUNTER — Other Ambulatory Visit: Payer: Self-pay | Admitting: Family Medicine

## 2021-08-02 MED ORDER — SUMATRIPTAN SUCCINATE 50 MG PO TABS: 50.0000 mg | ORAL_TABLET | ORAL | 0 refills | Status: AC | PRN

## 2021-08-02 NOTE — Progress Notes (Signed)
Patient called nurse line requesting refill for his migraines which was forwarded to me. He was last prescribed this in April. Will refill

## 2023-05-08 ENCOUNTER — Other Ambulatory Visit: Payer: Self-pay

## 2023-05-08 ENCOUNTER — Emergency Department (HOSPITAL_COMMUNITY)
Admission: EM | Admit: 2023-05-08 | Discharge: 2023-05-08 | Disposition: A | Discharge status: Home / Self Care | Payer: Self-pay | Attending: Emergency Medicine | Admitting: Emergency Medicine

## 2023-05-08 ENCOUNTER — Emergency Department (HOSPITAL_COMMUNITY): Payer: Self-pay

## 2023-05-08 ENCOUNTER — Encounter (HOSPITAL_COMMUNITY): Payer: Self-pay

## 2023-05-08 DIAGNOSIS — R4182 Altered mental status, unspecified: Secondary | ICD-10-CM | POA: Insufficient documentation

## 2023-05-08 DIAGNOSIS — R7889 Finding of other specified substances, not normally found in blood: Secondary | ICD-10-CM | POA: Insufficient documentation

## 2023-05-08 DIAGNOSIS — R220 Localized swelling, mass and lump, head: Secondary | ICD-10-CM | POA: Insufficient documentation

## 2023-05-08 LAB — CBC
HCT: 48.2 % (ref 39.0–52.0)
Hemoglobin: 16 g/dL (ref 13.0–17.0)
MCH: 27.9 pg (ref 26.0–34.0)
MCHC: 33.2 g/dL (ref 30.0–36.0)
MCV: 84.1 fL (ref 80.0–100.0)
Platelets: 251 K/uL (ref 150–400)
RBC: 5.73 MIL/uL (ref 4.22–5.81)
RDW: 14.4 % (ref 11.5–15.5)
WBC: 15.4 K/uL — ABNORMAL HIGH (ref 4.0–10.5)
nRBC: 0 % (ref 0.0–0.2)

## 2023-05-08 LAB — BASIC METABOLIC PANEL WITH GFR
Anion gap: 13 (ref 5–15)
BUN: 10 mg/dL (ref 6–20)
CO2: 23 mmol/L (ref 22–32)
Calcium: 9.9 mg/dL (ref 8.9–10.3)
Chloride: 102 mmol/L (ref 98–111)
Creatinine, Ser: 1.44 mg/dL — ABNORMAL HIGH (ref 0.61–1.24)
GFR, Estimated: >60 mL/min
Glucose, Bld: 100 mg/dL — ABNORMAL HIGH (ref 70–99)
Potassium: 4.1 mmol/L (ref 3.5–5.1)
Sodium: 138 mmol/L (ref 135–145)

## 2023-05-08 LAB — CBG MONITORING, ED: Glucose-Capillary: 83 mg/dL (ref 70–99)

## 2023-05-08 NOTE — ED Triage Notes (Signed)
Pt arrives via POV. Pt woke up this morning and noticed he had bit his tongue. Pt reports he felt like there was a gap in his memory from yesterday. No known hx seizures. Pt is currently alert and oriented x 4. No blood thinners. Denies headache.

## 2023-05-08 NOTE — ED Provider Notes (Signed)
[place] EMERGENCY DEPARTMENT AT [facility] Provider Note   CSN: [id number] Arrival date & time: 05/08/23  1822     History  Chief Complaint  Patient presents with   Oral Swelling    Bit tongue    Altered Mental Status    Austin Rivers is a 37 y.o. male.  37 year old male who presents with periods of confusion as well as tongue biting when he awoke this morning.  No bowel or bladder dysfunction.  No prior history of seizure disorder.  No recent illnesses.  No recent medications started.  He denies any focal weakness.  No recent fever or chills.  No history of illicit drug use.  No treatment use prior to arrival       Home Medications Prior to Admission medications   Medication Sig Start Date End Date Taking? Authorizing Provider  diphenhydramine-acetaminophen (TYLENOL PM) 25-500 MG TABS Take 1 tablet by mouth at bedtime as needed.    [provider]  nicotine (NICODERM CQ - DOSED IN MG/24 HOURS) 14 mg/24hr patch Place 1 patch (14 mg total) onto the skin daily. 03/21/21   Cora Collum, DO  SUMAtriptan (IMITREX) 50 MG tablet Take 1 tablet (50 mg total) by mouth as needed for migraine. May repeat in 2 hours if headache persists or recurs. 08/02/21   Cora Collum, DO      Allergies    Patient has no known allergies.    Review of Systems   Review of Systems  All other systems reviewed and are negative.   Physical Exam Updated Vital Signs BP (!) 140/80   Pulse 68   Temp 99 F (37.2 C)   Resp 13   Ht 1.778 m (5\' 10" )   Wt 90.7 kg   SpO2 100%   BMI 28.70 kg/m  Physical Exam Vitals and nursing note reviewed.  Constitutional:      General: He is not in acute distress.    Appearance: Normal appearance. He is well-developed. He is not toxic-appearing.  HENT:     Head: Normocephalic and atraumatic.  Eyes:     General: Lids are normal.     Conjunctiva/sclera: Conjunctivae normal.     Pupils: Pupils are equal, round, and reactive to light.   Neck:     Thyroid: No thyroid mass.     Trachea: No tracheal deviation.  Cardiovascular:     Rate and Rhythm: Normal rate and regular rhythm.     Heart sounds: Normal heart sounds. No murmur heard.    No gallop.  Pulmonary:     Effort: Pulmonary effort is normal. No respiratory distress.     Breath sounds: Normal breath sounds. No stridor. No decreased breath sounds, wheezing, rhonchi or rales.  Abdominal:     General: There is no distension.     Palpations: Abdomen is soft.     Tenderness: There is no abdominal tenderness. There is no rebound.  Musculoskeletal:        General: No tenderness. Normal range of motion.     Cervical back: Normal range of motion and neck supple.  Skin:    General: Skin is warm and dry.     Findings: No abrasion or rash.  Neurological:     General: No focal deficit present.     Mental Status: He is alert and oriented to person, place, and time. Mental status is at baseline.     GCS: GCS eye subscore is 4. GCS verbal subscore is  5. GCS motor subscore is 6.     Cranial Nerves: No cranial nerve deficit.     Sensory: No sensory deficit.     Motor: Motor function is intact.     Gait: Gait is intact.  Psychiatric:        Attention and Perception: Attention normal.        Speech: Speech normal.        Behavior: Behavior normal.     ED Results / Procedures / Treatments   Labs (all labs ordered are listed, but only abnormal results are displayed) Labs Reviewed  CBC - Abnormal; Notable for the following components:      Result Value   WBC 15.4 (*)    All other components within normal limits  BASIC METABOLIC PANEL - Abnormal; Notable for the following components:   Glucose, Bld 100 (*)    Creatinine, Ser 1.44 (*)    All other components within normal limits  CBG MONITORING, ED    EKG None  Radiology No results found.  Procedures Procedures    Medications Ordered in ED Medications - No data to display  ED Course/ Medical Decision  Making/ A&P                             Medical Decision Making Amount and/or Complexity of Data Reviewed Labs: ordered. Radiology: ordered. ECG/medicine tests: ordered.   Patient's electrolytes within normal limits other than mildly elevated creatinine.  Head CT was negative per my interpretation.  Possibility of seizure as cause of his symptoms.  Will give neurological referral        Final Clinical Impression(s) / ED Diagnoses Final diagnoses:  None    Rx / DC Orders ED Discharge Orders     None         Lorre Nick, MD 05/08/23 2251

## 2023-05-08 NOTE — ED Notes (Signed)
Pt was given a warm blanket at this time

## 2023-05-08 NOTE — Discharge Instructions (Signed)
Return here if you have any further symptoms such as seizure activity, prolonged confusion, or any other problems

## 2024-12-24 ENCOUNTER — Emergency Department (HOSPITAL_BASED_OUTPATIENT_CLINIC_OR_DEPARTMENT_OTHER)
Admission: EM | Admit: 2024-12-24 | Discharge: 2024-12-24 | Disposition: A | Discharge status: Home / Self Care | Payer: Self-pay | Attending: Emergency Medicine | Admitting: Emergency Medicine

## 2024-12-24 ENCOUNTER — Encounter: Payer: Self-pay | Admitting: Emergency Medicine

## 2024-12-24 ENCOUNTER — Encounter (HOSPITAL_COMMUNITY): Payer: Self-pay | Admitting: Emergency Medicine

## 2024-12-24 ENCOUNTER — Ambulatory Visit: Admission: EM | Admit: 2024-12-24 | Discharge: 2024-12-24 | Payer: Self-pay

## 2024-12-24 ENCOUNTER — Other Ambulatory Visit: Payer: Self-pay

## 2024-12-24 ENCOUNTER — Emergency Department (HOSPITAL_COMMUNITY)
Admission: EM | Admit: 2024-12-24 | Discharge: 2024-12-24 | Discharge status: Against Medical Advice | Payer: Self-pay | Attending: Emergency Medicine | Admitting: Emergency Medicine

## 2024-12-24 ENCOUNTER — Encounter (HOSPITAL_BASED_OUTPATIENT_CLINIC_OR_DEPARTMENT_OTHER): Payer: Self-pay

## 2024-12-24 DIAGNOSIS — R1310 Dysphagia, unspecified: Secondary | ICD-10-CM

## 2024-12-24 DIAGNOSIS — Z5321 Procedure and treatment not carried out due to patient leaving prior to being seen by health care provider: Secondary | ICD-10-CM | POA: Insufficient documentation

## 2024-12-24 DIAGNOSIS — R09A2 Foreign body sensation, throat: Secondary | ICD-10-CM

## 2024-12-24 DIAGNOSIS — W44F3XA Food entering into or through a natural orifice, initial encounter: Secondary | ICD-10-CM

## 2024-12-24 DIAGNOSIS — R0989 Other specified symptoms and signs involving the circulatory and respiratory systems: Secondary | ICD-10-CM | POA: Insufficient documentation

## 2024-12-24 NOTE — ED Notes (Signed)
 Pt able to drink cup of water without difficulties, no choking or coughing observed. Pt states he feels much better than prior to arrival.

## 2024-12-24 NOTE — Discharge Instructions (Signed)
 Return to the ER if you experience any new and/or concerning issues.

## 2024-12-24 NOTE — ED Triage Notes (Signed)
 Pt states he was eating chicken this morning and choked on it. When he coughed it up he also noticed some blood. Since then it has been difficult to swallow.

## 2024-12-24 NOTE — Discharge Instructions (Addendum)
 Please head to the ER. You probably need an endoscopy (camera down throat) to make sure there is no chicken stuck. It is concerning that you can't handle your saliva or water.

## 2024-12-24 NOTE — ED Notes (Signed)
 Patient is being discharged from the Urgent Care and sent to the Emergency Department via POV . Per Leita Molly PA, patient is in need of higher level of care due to dysphagia. Patient is aware and verbalizes understanding of plan of care.  Vitals:   12/24/24 1527  BP: 125/81  Pulse: 73  Resp: 16  Temp: 98.2 F (36.8 C)  SpO2: 96%

## 2024-12-24 NOTE — ED Triage Notes (Signed)
 Pt reports choking on chicken and having a possible food bolus. Pt sent to [facility] from UC. Pt reports sitting in lobby for 6 hours. Pt reports being unable to swallow spit.

## 2024-12-24 NOTE — ED Provider Notes (Signed)
 " [name] EMERGENCY DEPARTMENT AT [facility] Provider Note   CSN: [id number] Arrival date & time: 12/24/24  2209     Patient presents with: Sore Throat and Food Bolus   Austin Rivers is a 39 y.o. male.   Patient is a 39 year old male with no significant past medical history.  Patient presents with a possible esophageal food bolus.  Patient was eating chicken wings earlier today when he believes he choked on 1.  He began retching, then brought up a piece of chicken.  He noticed a little bit of blood present when he gagged.  After this episode, he was unable to drink or eat and could not swallow his own saliva.  He initially went to urgent care who sent him to [facility].  He left [facility], then came here due to extended wait time.  He is now feeling better and states that he is able to handle his secretions.       Prior to Admission medications  Medication Sig Start Date End Date Taking? Authorizing Provider  diphenhydramine-acetaminophen  (TYLENOL  PM) 25-500 MG TABS Take 1 tablet by mouth at bedtime as needed.    [provider]  nicotine  (NICODERM CQ  - DOSED IN MG/24 HOURS) 14 mg/24hr patch Place 1 patch (14 mg total) onto the skin daily. 03/21/21   Paige, Victoria J, DO  SUMAtriptan  (IMITREX ) 50 MG tablet Take 1 tablet (50 mg total) by mouth as needed for migraine. May repeat in 2 hours if headache persists or recurs. 08/02/21   Paige, Victoria J, DO    Allergies: Patient has no known allergies.    Review of Systems  All other systems reviewed and are negative.   Updated Vital Signs BP (!) 134/92   Pulse 77   Temp 98.6 F (37 C)   Resp 20   Ht 5' 10 (1.778 m)   Wt 90.7 kg   SpO2 99%   BMI 28.70 kg/m   Physical Exam Vitals and nursing note reviewed.  Constitutional:      Appearance: He is well-developed.  HENT:     Head: Normocephalic.     Mouth/Throat:     Mouth: Mucous membranes are moist.     Tonsils: No tonsillar exudate.  Cardiovascular:      Rate and Rhythm: Normal rate.  Pulmonary:     Effort: Pulmonary effort is normal.     Breath sounds: Normal breath sounds. No wheezing or rhonchi.  Abdominal:     Palpations: Abdomen is soft.     Tenderness: There is no abdominal tenderness.  Skin:    General: Skin is warm and dry.  Neurological:     Mental Status: He is alert and oriented to person, place, and time.     (all labs ordered are listed, but only abnormal results are displayed) Labs Reviewed - No data to display  EKG: None  Radiology: No results found.   Procedures   Medications Ordered in the ED - No data to display                                  Medical Decision Making  Patient presenting after esophageal food bolus as described in the HPI.  He is now feeling better and is able to handle his secretions.  He was given a couple of ice water which she was able to consume without difficulty.  I suspect the piece of  chicken lodged in his esophagus has passed and feel as though patient can safely be discharged.     Final diagnoses:  None    ED Discharge Orders     None          Austin Berg, MD 12/24/24 2338  "

## 2024-12-24 NOTE — ED Provider Notes (Signed)
 " [facility]    CSN: [id number] Arrival date & time: 12/24/24  1506      History   Chief Complaint Chief Complaint  Patient presents with   Dysphagia    HPI Austin Rivers is a 39 y.o. male presenting w swallowing issue. States ate a piece of chicken 2 hours ago, couldn't swallow it, coughed back up and there was blood. Continued globus sensation. Now he is unable to swallow saliva or water. Denies abd pain.  HPI  History reviewed. No pertinent past medical history.  Patient Active Problem List   Diagnosis Date Noted   Tobacco use 05/13/2021    History reviewed. No pertinent surgical history.     Home Medications    Prior to Admission medications  Medication Sig Start Date End Date Taking? Authorizing Provider  diphenhydramine-acetaminophen  (TYLENOL  PM) 25-500 MG TABS Take 1 tablet by mouth at bedtime as needed.    [provider]  nicotine  (NICODERM CQ  - DOSED IN MG/24 HOURS) 14 mg/24hr patch Place 1 patch (14 mg total) onto the skin daily. 03/21/21   Paige, Victoria J, DO  SUMAtriptan  (IMITREX ) 50 MG tablet Take 1 tablet (50 mg total) by mouth as needed for migraine. May repeat in 2 hours if headache persists or recurs. 08/02/21   Carlyon Richerd PARAS, DO    Family History History reviewed. No pertinent family history.  Social History Social History[1]   Allergies   Patient has no known allergies.   Review of Systems Review of Systems  HENT:  Positive for trouble swallowing.      Physical Exam Triage Vital Signs ED Triage Vitals  Encounter Vitals Group     BP      Girls Systolic BP Percentile      Girls Diastolic BP Percentile      Boys Systolic BP Percentile      Boys Diastolic BP Percentile      Pulse      Resp      Temp      Temp src      SpO2      Weight      Height      Head Circumference      Peak Flow      Pain Score      Pain Loc      Pain Education      Exclude from Growth Chart    No data found.  Updated  Vital Signs BP 125/81 (BP Location: Left Arm)   Pulse 73   Temp 98.2 F (36.8 C) (Oral)   Resp 16   Wt 207 lb (93.9 kg)   SpO2 96%   BMI 29.70 kg/m   Visual Acuity Right Eye Distance:   Left Eye Distance:   Bilateral Distance:    Right Eye Near:   Left Eye Near:    Bilateral Near:     Physical Exam Vitals reviewed.  Constitutional:      General: He is not in acute distress.    Appearance: Normal appearance. He is not ill-appearing or diaphoretic.  HENT:     Head: Normocephalic and atraumatic.     Mouth/Throat:     Comments: Scalloped tongue. Cardiovascular:     Rate and Rhythm: Normal rate and regular rhythm.     Heart sounds: Normal heart sounds.  Pulmonary:     Effort: Pulmonary effort is normal.     Breath sounds: Normal breath sounds.  Skin:    General: Skin  is warm.  Neurological:     General: No focal deficit present.     Mental Status: He is alert and oriented to person, place, and time.  Psychiatric:        Mood and Affect: Mood normal.        Behavior: Behavior normal.        Thought Content: Thought content normal.        Judgment: Judgment normal.      UC Treatments / Results  Labs (all labs ordered are listed, but only abnormal results are displayed) Labs Reviewed - No data to display  EKG   Radiology No results found.  Procedures Procedures (including critical care time)  Medications Ordered in UC Medications - No data to display  Initial Impression / Assessment and Plan / UC Course  I have reviewed the triage vital signs and the nursing notes.  Pertinent labs & imaging results that were available during my care of the patient were reviewed by me and considered in my medical decision making (see chart for details).     Patient is a pleasant 39 y.o. male presenting with globus sensation and difficulty swallowing.  This started about 2 hours ago after he choked on a piece of chicken, coughed it up, and there was blood.  Now he is  having difficulty handling secretions or fluids, and there is continued globus sensation.  I have concern for esophageal stricture, and retained foreign body in the throat.  Following discussion, the patient will head to the emergency department.  He is stable for transport in POV.  Final Clinical Impressions(s) / UC Diagnoses   Final diagnoses:  Globus sensation  Dysphagia, unspecified type     Discharge Instructions      Please head to the ER. You probably need an endoscopy (camera down throat) to make sure there is no chicken stuck. It is concerning that you can't handle your saliva or water.      ED Prescriptions   None    PDMP not reviewed this encounter.     [1]  Social History Tobacco Use   Smoking status: Every Day    Types: Cigarettes  Vaping Use   Vaping status: Every Day  Substance Use Topics   Alcohol use: Yes   Drug use: Yes    Types: Marijuana     Arlyss Leita BRAVO, PA-C 12/24/24 1538  "

## 2024-12-24 NOTE — ED Triage Notes (Signed)
 Pt sent by UC for food bolus and possible endoscopy.  Pt finds it difficult to swallow  This happened after choking on a piece of chicken this morning.
